# Patient Record
Sex: Female | Born: 1965 | Hispanic: Yes | Marital: Married | Smoking: Never smoker
Health system: Southern US, Community
[De-identification: ages and names within clinical notes are randomized; demographics above are authoritative.]

## PROBLEM LIST (undated history)

## (undated) HISTORY — PX: BILATERAL CARPAL TUNNEL RELEASE: SHX6508

---

## 2006-01-05 ENCOUNTER — Ambulatory Visit: Payer: Self-pay | Admitting: General Practice

## 2006-01-16 ENCOUNTER — Ambulatory Visit: Payer: Self-pay | Admitting: Family Medicine

## 2006-02-10 ENCOUNTER — Ambulatory Visit: Payer: Self-pay | Admitting: Family Medicine

## 2006-09-16 ENCOUNTER — Other Ambulatory Visit: Payer: Self-pay

## 2006-09-16 ENCOUNTER — Observation Stay: Payer: Self-pay | Admitting: Internal Medicine

## 2006-09-17 ENCOUNTER — Other Ambulatory Visit: Payer: Self-pay

## 2007-06-07 IMAGING — MG MAM [PHONE_NUMBER]^^MAM DGTL ADD VW LT  SCR^^
1 series · 2 of 2 positions shown · non-contrast
Comparison: none

REASON FOR EXAM: Left Breast Nodular Densities
COMMENTS:

[Series 6324: L MLO · left · 2 of 2 slices shown]
[im 1/2]
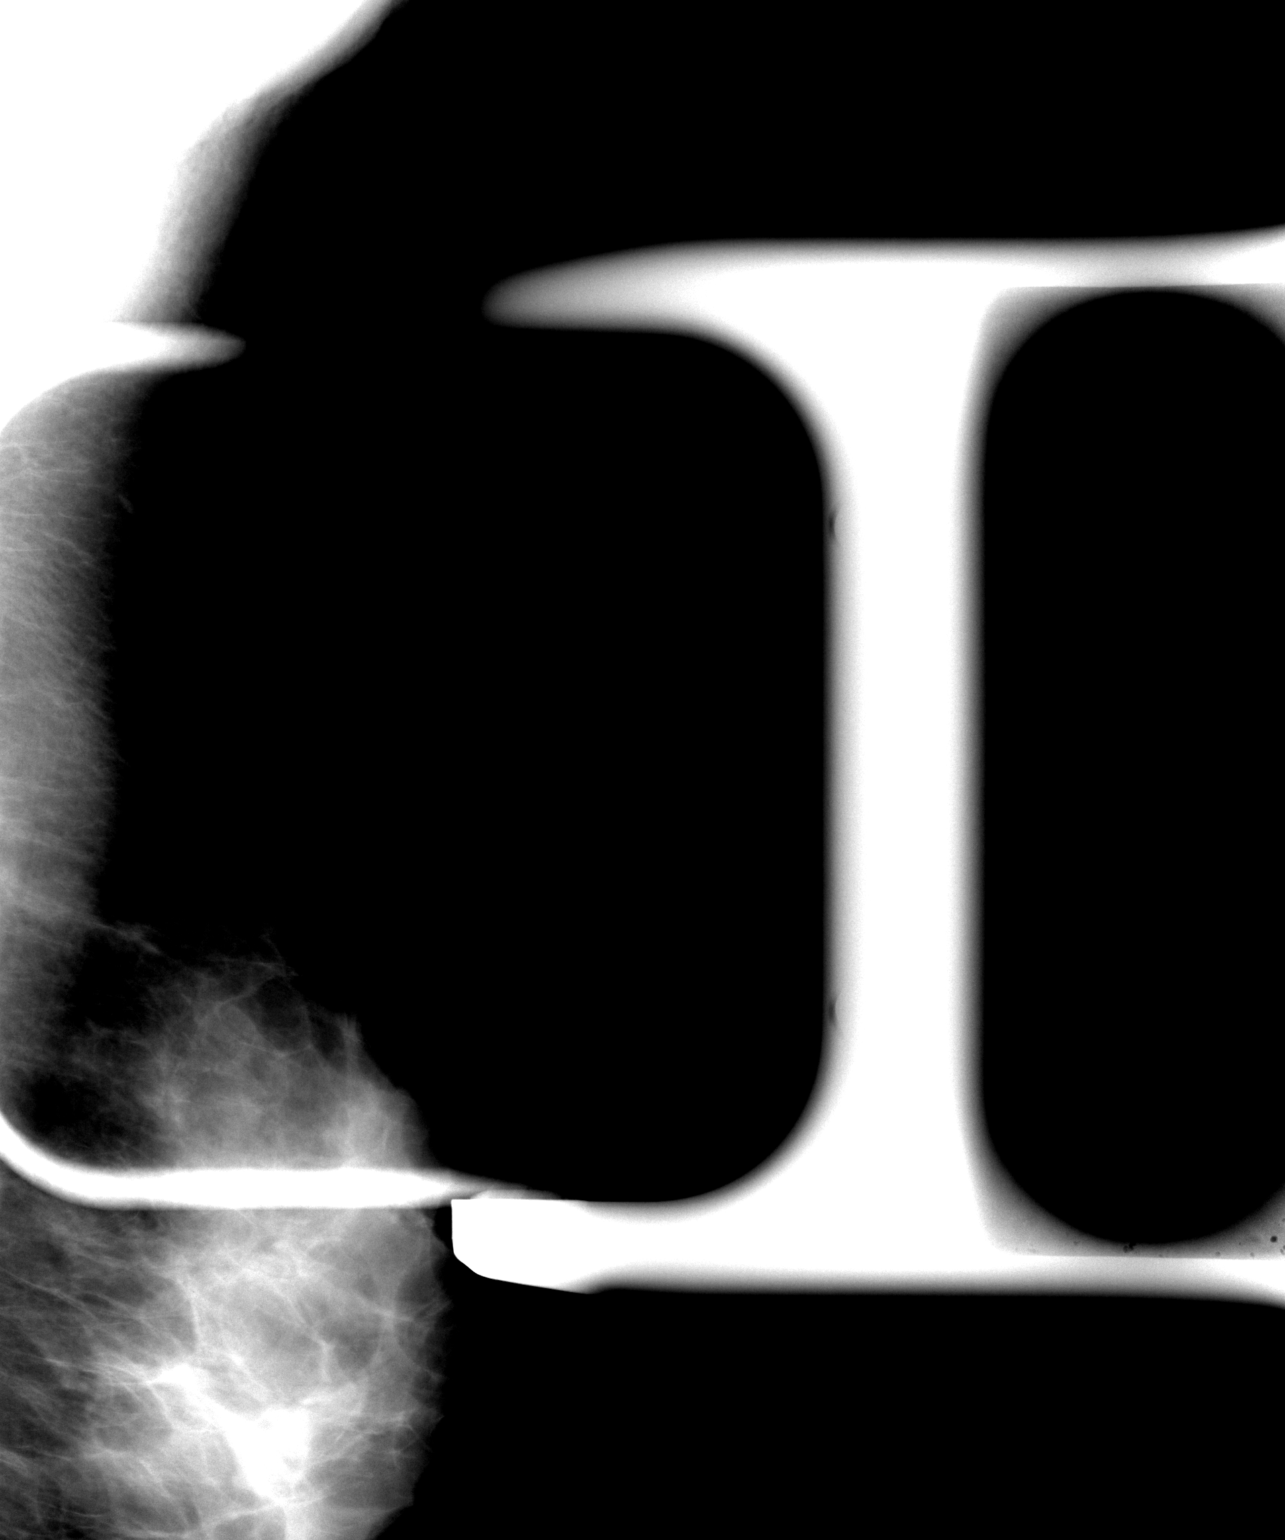
[im 2/2]
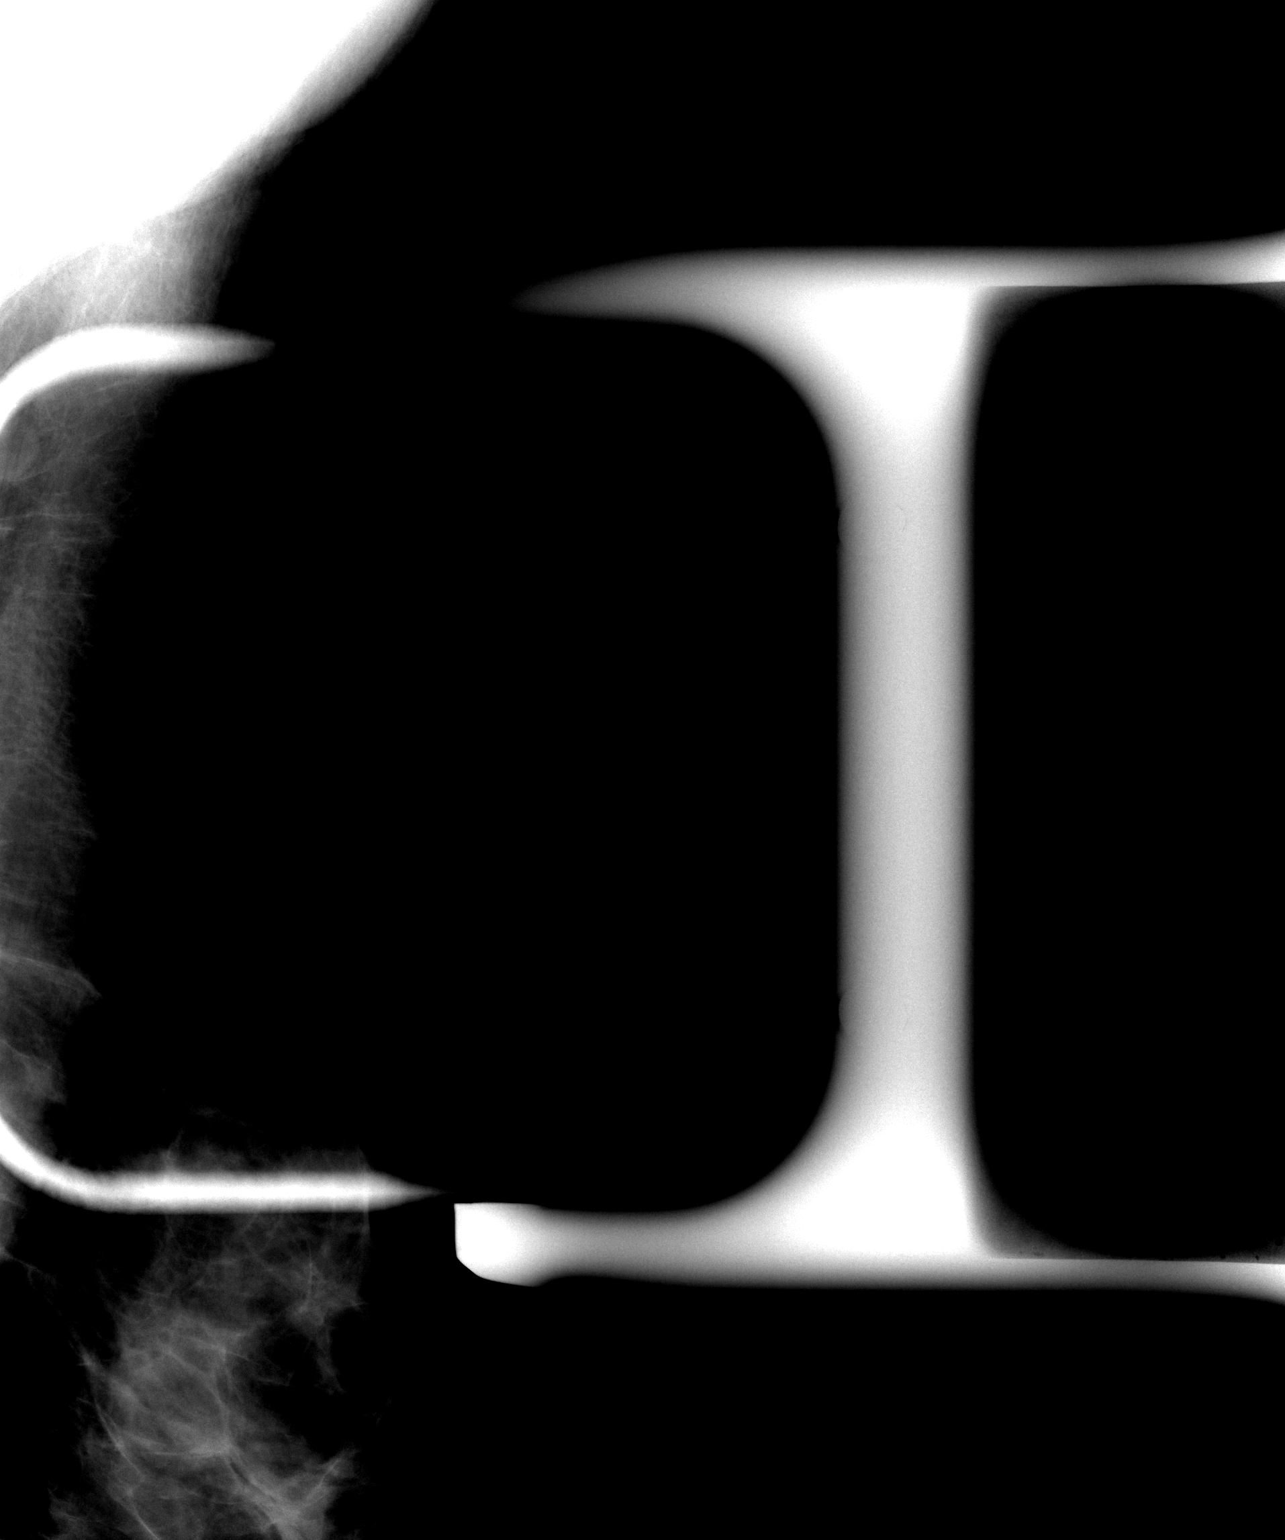

[2 of 2 positions shown; findings below may reference images not displayed]

PROCEDURE:     MAM - MAM DGTL ADD VW LT  SCR  - January 16, 2006 [DATE]

RESULT:        Nodular densities noted in the LEFT breast.  Compression spot
films reveal fat in the nodular densities suggesting that this is benign
intramammary lymph nodes.  No further evaluation of this tiny nodular
density in the LEFT breast is necessary.
IMPRESSION: 1.     Persistent small lymph nodes in the LEFT axilla noted, clinically
correlation suggested.
2.     Yearly follow up mammogram is suggested.
3.     BI-RADS: Category 2-Benign Findings.

A NEGATIVE MAMMOGRAM REPORT DOES NOT PRECLUDE BIOPSY OR OTHER EVALUATION OF
A CLINICALLY PALPABLE OR OTHERWISE SUSPICIOUS MASS OR LESION.   BREAST

## 2007-10-03 ENCOUNTER — Ambulatory Visit: Payer: Self-pay

## 2007-10-16 ENCOUNTER — Ambulatory Visit: Payer: Self-pay

## 2014-06-05 ENCOUNTER — Ambulatory Visit: Payer: Self-pay | Admitting: Specialist

## 2014-06-10 ENCOUNTER — Ambulatory Visit: Payer: Self-pay | Admitting: Specialist

## 2014-09-16 ENCOUNTER — Ambulatory Visit: Payer: Self-pay | Admitting: Internal Medicine

## 2014-10-11 NOTE — Op Note (Signed)
PATIENT NAMGennie Jackson:  Lanphier, [name]:  [id number] DATE OF BIRTH:  Jan 02, 1966  DATE OF PROCEDURE:  06/10/2014  PREOPERATIVE DIAGNOSIS: Bilateral carpal tunnel syndrome.   POSTOPERATIVE DIAGNOSIS: Bilateral carpal tunnel syndrome.   PROCEDURES PERFORMED:  1.  Right carpal tunnel release.  2.  Left carpal tunnel release.   SURGEON:  Myra Rudehristopher Breleigh Carpino, M.D.   ANESTHESIA: General.   COMPLICATIONS: None.   TOURNIQUET TIME: 10 minutes on the right and 14 minutes on the left.   PROCEDURE: After adequate induction of general anesthesia both upper extremities were thoroughly prepped with alcohol and ChloraPrep and draped in standard sterile fashion. Identical procedures are performed on each side. The extremity is wrapped out with the Esmarch bandage and pneumatic tourniquet elevated to 250 mmHg. The tourniquet on the left was actually placed on the forearm and the tourniquet was elevated to 150 mmHg. Under loupe magnification, standard volar carpal tunnel incision is made and the dissection carefully carried down to the transverse retinacular ligament. Incision is made in the midportion of the ligament. The distal release is performed with the small scissors. The proximal release is performed with the small scissors and the carpal tunnel scissors. There is seen to be moderate compression of the nerve directly beneath the ligament on each side. The wound is thoroughly irrigated multiple times. Skin edges are infiltrated with 0.5% plain Marcaine. The skin is closed with 4-0 nylon. A soft bulky dressing is applied. The tourniquet is released. The patient is returned to the recovery room in satisfactory condition having tolerated the procedure quite well.    ____________________________ Clare Gandyhristopher E. Faryal Marxen, MD ces:at D: 06/10/2014 08:58:22 ET T: 06/10/2014 09:38:58 ET JOB#: [id number]  cc: Clare Gandyhristopher E. Aditri Louischarles, MD, <Dictator> Clare GandyHRISTOPHER E Horace Lukas MD ELECTRONICALLY SIGNED 06/15/2014 16:56

## 2017-03-28 ENCOUNTER — Other Ambulatory Visit: Payer: Self-pay | Admitting: Internal Medicine

## 2017-03-28 DIAGNOSIS — Z1231 Encounter for screening mammogram for malignant neoplasm of breast: Secondary | ICD-10-CM

## 2017-04-18 ENCOUNTER — Ambulatory Visit
Admission: RE | Admit: 2017-04-18 | Discharge: 2017-04-18 | Disposition: A | Discharge status: Home / Self Care | Payer: BLUE CROSS/BLUE SHIELD | Source: Ambulatory Visit | Attending: Internal Medicine | Admitting: Internal Medicine

## 2017-04-18 DIAGNOSIS — Z1231 Encounter for screening mammogram for malignant neoplasm of breast: Secondary | ICD-10-CM | POA: Insufficient documentation

## 2017-07-14 ENCOUNTER — Encounter: Payer: Self-pay | Admitting: *Deleted

## 2017-09-29 ENCOUNTER — Encounter: Payer: Self-pay | Admitting: Internal Medicine

## 2017-10-25 ENCOUNTER — Encounter: Payer: Self-pay | Admitting: *Deleted

## 2017-12-12 ENCOUNTER — Encounter: Payer: Self-pay | Admitting: Gastroenterology

## 2017-12-12 ENCOUNTER — Ambulatory Visit: Payer: BLUE CROSS/BLUE SHIELD | Admitting: Gastroenterology

## 2017-12-12 ENCOUNTER — Other Ambulatory Visit
Admission: RE | Admit: 2017-12-12 | Discharge: 2017-12-12 | Disposition: A | Discharge status: Home / Self Care | Payer: BLUE CROSS/BLUE SHIELD | Source: Ambulatory Visit | Attending: Gastroenterology | Admitting: Gastroenterology

## 2017-12-12 VITALS — BP 144/82 | HR 71 | Ht 61.0 in | Wt 145.8 lb

## 2017-12-12 DIAGNOSIS — K5900 Constipation, unspecified: Secondary | ICD-10-CM

## 2017-12-12 DIAGNOSIS — R1013 Epigastric pain: Secondary | ICD-10-CM | POA: Diagnosis not present

## 2017-12-12 DIAGNOSIS — G8929 Other chronic pain: Secondary | ICD-10-CM | POA: Diagnosis not present

## 2017-12-12 DIAGNOSIS — R1084 Generalized abdominal pain: Secondary | ICD-10-CM | POA: Diagnosis not present

## 2017-12-12 DIAGNOSIS — K589 Irritable bowel syndrome without diarrhea: Secondary | ICD-10-CM | POA: Diagnosis not present

## 2017-12-12 LAB — CBC WITH DIFFERENTIAL/PLATELET
BASOS ABS: 0 10*3/uL (ref 0–0.1)
BASOS PCT: 1 %
EOS ABS: 0.1 10*3/uL (ref 0–0.7)
EOS PCT: 2 %
HCT: 36.6 % (ref 35.0–47.0)
Hemoglobin: 12.8 g/dL (ref 12.0–16.0)
LYMPHS ABS: 2.4 10*3/uL (ref 1.0–3.6)
Lymphocytes Relative: 37 %
MCH: 30.7 pg (ref 26.0–34.0)
MCHC: 34.9 g/dL (ref 32.0–36.0)
MCV: 88.1 fL (ref 80.0–100.0)
Monocytes Absolute: 0.5 10*3/uL (ref 0.2–0.9)
Monocytes Relative: 7 %
Neutro Abs: 3.4 10*3/uL (ref 1.4–6.5)
Neutrophils Relative %: 53 %
PLATELETS: 250 10*3/uL (ref 150–440)
RBC: 4.16 MIL/uL (ref 3.80–5.20)
RDW: 12.9 % (ref 11.5–14.5)
WBC: 6.4 10*3/uL (ref 3.6–11.0)

## 2017-12-12 LAB — HEPATIC FUNCTION PANEL
ALBUMIN: 4.1 g/dL (ref 3.5–5.0)
ALK PHOS: 91 U/L (ref 38–126)
ALT: 22 U/L (ref 0–44)
AST: 23 U/L (ref 15–41)
Bilirubin, Direct: <0.1 mg/dL (ref 0.0–0.2)
TOTAL PROTEIN: 7.6 g/dL (ref 6.5–8.1)
Total Bilirubin: 0.4 mg/dL (ref 0.3–1.2)

## 2017-12-12 MED ORDER — OMEPRAZOLE 40 MG PO CPDR: 40.0000 mg | DELAYED_RELEASE_CAPSULE | Freq: Every day | ORAL | 3 refills | Status: DC

## 2017-12-12 MED ORDER — OMEPRAZOLE 40 MG PO CPDR: 40.0000 mg | DELAYED_RELEASE_CAPSULE | Freq: Every day | ORAL | 3 refills | Status: AC

## 2017-12-12 NOTE — Progress Notes (Signed)
Wyline MoodKiran Devaeh Amadi MD, MRCP(U.K) [address]  [address]  [place], [place] [address]  Main: [phone]  Fax: [phone]   Gastroenterology Consultation  Referring Provider:     Center, Darcella Gasmanharles [facility] Primary Care Physician:  Center, [facility] Primary Gastroenterologist:  Dr. Wyline MoodKiran Senica Crall  Reason for Consultation:     Colonoscopy,abdominal pain         HPI:   Rebecca Jackson is a 52 y.o. y/o female referred for consultation & management  by Dr. Eli Phillipsenter, [facility].    She has been referred back in 03/2017 for a colonoscopy . No recent labs or imaging to review.  She also says she has abdominal pain . She is here with an interpretor .   Abdominal pain: Onset: Began 1 year, getting worse. Occurs every day at times and sometimes atleast once a week. Each episode lasts an hour  Site :Epiagstric  Radiation: localized mostly and at timees makes her breathing hard , sleeps almost sitting up and it helps  Severity : 4-6/10  Ashby Dawesature of pain: sharp  Aggravating factors: eating , pain occurs 40 mins after eating - if she drinks water gets worse. Her mother had her gall bladder taken out, her sister as well.  Relieving factors :yoghurt  Weight loss: gained  NSAID use: once a month  PPI use :her doctor prescribed something which she has not taken  Gall bladder surgery: intact  Frequency of bowel movements: every other day , hard  Change in bowel movements: always like this  Relief with bowel movements: yes  Gas/Bloating/Abdominal distension: yes and better after a bowel movement   She has not had a colonoscopy. No family history of colon cancer or polyps.       History reviewed. No pertinent past medical history.    Prior to Admission medications   Medication Sig Start Date End Date Taking? Authorizing Provider  cyclobenzaprine (FLEXERIL) 10 MG tablet Take by mouth. 05/24/15  Yes [provider]    No family history on file.    Social History   Tobacco Use  . Smoking status: Never Smoker  . Smokeless tobacco: Never Used  Substance Use Topics  . Alcohol use: Not Currently  . Drug use: Never    Allergies as of 12/12/2017  . (Not on File)    Review of Systems:    All systems reviewed and negative except where noted in HPI.   Physical Exam:  BP (!) 144/82 (BP Location: Left Arm, Patient Position: Sitting, Cuff Size: Normal)   Pulse 71   Ht 5\' 1"  (1.549 m)   Wt 145 lb 12.8 oz (66.1 kg)   BMI 27.55 kg/m  No LMP recorded. Patient is premenopausal. Psych:  Alert and cooperative. Normal mood and affect. General:   Alert,  Well-developed, well-nourished, pleasant and cooperative in NAD Head:  Normocephalic and atraumatic. Eyes:  Sclera clear, no icterus.   Conjunctiva pink. Ears:  Normal auditory acuity. Nose:  No deformity, discharge, or lesions. Mouth:  No deformity or lesions,oropharynx pink & moist. Neck:  Supple; no masses or thyromegaly. Lungs:  Respirations even and unlabored.  Clear throughout to auscultation.   No wheezes, crackles, or rhonchi. No acute distress. Heart:  Regular rate and rhythm; no murmurs, clicks, rubs, or gallops. Abdomen:  Normal bowel sounds.  No bruits.  Soft, non-tender and non-distended without masses, hepatosplenomegaly or hernias noted.  No guarding or rebound tenderness.    Neurologic:  Alert and oriented  x3;  grossly normal neurologically. Skin:  Intact without significant lesions or rashes. No jaundice. Lymph Nodes:  No significant cervical adenopathy. Psych:  Alert and cooperative. Normal mood and affect.  Imaging Studies: No results found.  Assessment and Plan:   Rebecca Jackson is a 52 y.o. y/o female has been referred for colonosocpy and abdominal pain. Differentials for abdominal pain are IBS-C, peptic ulcers, biliary colic.   Plan  1. Trial of lizness 72 mcg - 2 weeks samples provided 2. Trial of PPI,check H pylori stool antigen  3. EGD+colonoscopy  4.  RUQ USG  I have discussed alternative options, risks & benefits,  which include, but are not limited to, bleeding, infection, perforation,respiratory complication & drug reaction.  The patient agrees with this plan & written consent will be obtained.     Follow up in 4 weeks   Dr Wyline Mood MD,MRCP(U.K)

## 2017-12-12 NOTE — Patient Instructions (Addendum)
1. [facility] en [phone] cita de ultrasonido  2.  Tomar diariamente 1 tableta de Linzess 72mcg.  3. Heces mas pruebas en el [facility]atorio [facility]

## 2017-12-13 ENCOUNTER — Other Ambulatory Visit
Admission: RE | Admit: 2017-12-13 | Discharge: 2017-12-13 | Disposition: A | Discharge status: Home / Self Care | Payer: BLUE CROSS/BLUE SHIELD | Source: Ambulatory Visit | Attending: Gastroenterology | Admitting: Gastroenterology

## 2017-12-13 DIAGNOSIS — R1013 Epigastric pain: Secondary | ICD-10-CM | POA: Diagnosis present

## 2017-12-13 DIAGNOSIS — G8929 Other chronic pain: Secondary | ICD-10-CM | POA: Diagnosis not present

## 2017-12-14 ENCOUNTER — Encounter: Payer: Self-pay | Admitting: Gastroenterology

## 2017-12-15 LAB — H. PYLORI ANTIGEN, STOOL: H. PYLORI STOOL AG, EIA: NEGATIVE

## 2017-12-19 ENCOUNTER — Ambulatory Visit
Admission: RE | Admit: 2017-12-19 | Discharge: 2017-12-19 | Disposition: A | Discharge status: Home / Self Care | Payer: BLUE CROSS/BLUE SHIELD | Source: Ambulatory Visit | Attending: Gastroenterology | Admitting: Gastroenterology

## 2017-12-19 DIAGNOSIS — G8929 Other chronic pain: Secondary | ICD-10-CM | POA: Diagnosis present

## 2017-12-19 DIAGNOSIS — R1013 Epigastric pain: Secondary | ICD-10-CM | POA: Insufficient documentation

## 2017-12-20 ENCOUNTER — Encounter: Payer: Self-pay | Admitting: Gastroenterology

## 2018-01-11 ENCOUNTER — Telehealth: Payer: Self-pay

## 2018-01-11 ENCOUNTER — Other Ambulatory Visit: Payer: Self-pay

## 2018-01-11 NOTE — Telephone Encounter (Signed)
Dr. Tobi BastosAnna, patients husband called to scheduled colonoscopy.  I thought it was a screening colonoscopy but noticed that you had seen her in the office for abdominal pain, epigastric pain on 12/12/17.  During this visit your plan was for EGD and Colonoscopy.  This is patients first colonoscopy.  What should the diagnosis code be for EGD/Colon?  I wanted to check with you before entering the order.  Scheduling for 02/05/18.  Please advise.  Thanks [employer]

## 2018-01-12 ENCOUNTER — Other Ambulatory Visit: Payer: Self-pay

## 2018-01-12 DIAGNOSIS — R1013 Epigastric pain: Secondary | ICD-10-CM

## 2018-01-12 DIAGNOSIS — Z1211 Encounter for screening for malignant neoplasm of colon: Secondary | ICD-10-CM

## 2018-01-12 NOTE — Telephone Encounter (Signed)
Yes EGD, colonoscopy was for abdonminal pain

## 2018-01-23 ENCOUNTER — Ambulatory Visit: Payer: BLUE CROSS/BLUE SHIELD | Admitting: Gastroenterology

## 2018-01-23 ENCOUNTER — Encounter: Payer: Self-pay | Admitting: Gastroenterology

## 2018-01-23 VITALS — BP 118/72 | HR 75 | Ht 61.0 in | Wt 148.2 lb

## 2018-01-23 DIAGNOSIS — K5900 Constipation, unspecified: Secondary | ICD-10-CM | POA: Diagnosis not present

## 2018-01-23 NOTE — Progress Notes (Signed)
   Wyline MoodKiran Braylan Faul MD, MRCP(U.K) [address]  [address]  [place], [place] [address]  Main: [phone]  Fax: [phone]   Primary Care Physician: Center[facility]  Primary Gastroenterologist:  Dr. Wyline MoodKiran Corbin Hott   No chief complaint on file.   HPI: Rebecca Jackson is a 52 y.o. female   Summary of history :  She is here today to see me as a follow up visit to her initial visit on 12/12/17 , abdominal pain began 1 year back, daily , each episode lasting 1 hour , epigastric, non radiating , 40 mins after eating , gained weight , was not on a PPI, constipation , relief with a bowel movements. She also had gas and bloating. Differentials were IBS-C, peptic ulcers, biliary colic   Interval history   1/30/86576/25/2019-  01/23/2018  12/19/17: RUQ USG: Normal  Scheduled for EGD+colonoscopy 02/05/18   H pylori stool antigen negative  She is here back with her interpretor form the hospital .   She says that she took the medications only for a few days. She said she didn't want to use the rest room at work.   While on the medications she was feeling much better, While on Linzess she was having a bowel movement every time she eats. Thinks the medications worked well.    Current Outpatient Medications  Medication Sig Dispense Refill  . omeprazole (PRILOSEC) 40 MG capsule Take 1 capsule (40 mg total) by mouth daily. 90 capsule 3   No current facility-administered medications for this visit.     Allergies as of 01/23/2018  . (Not on File)    ROS:  General: Negative for anorexia, weight loss, fever, chills, fatigue, weakness. ENT: Negative for hoarseness, difficulty swallowing , nasal congestion. CV: Negative for chest pain, angina, palpitations, dyspnea on exertion, peripheral edema.  Respiratory: Negative for dyspnea at rest, dyspnea on exertion, cough, sputum, wheezing.  GI: See history of present illness. GU:  Negative for dysuria, hematuria, urinary incontinence,  urinary frequency, nocturnal urination.  Endo: Negative for unusual weight change.    Physical Examination:   There were no vitals taken for this visit.  General: Well-nourished, well-developed in no acute distress.  Eyes: No icterus. Conjunctivae pink. Mouth: Oropharyngeal mucosa moist and pink , no lesions erythema or exudate. Lungs: Clear to auscultation bilaterally. Non-labored. Heart: Regular rate and rhythm, no murmurs rubs or gallops.  Abdomen: Bowel sounds are normal, nontender, nondistended, no hepatosplenomegaly or masses, no abdominal bruits or hernia , no rebound or guarding.   Extremities: No lower extremity edema. No clubbing or deformities. Neuro: Alert and oriented x 3.  Grossly intact. Skin: Warm and dry, no jaundice.   Psych: Alert and cooperative, normal mood and affect.   Imaging Studies: No results found.  Assessment and Plan:   Rebecca Jackson is a 52 y.o. y/o female here to follow up for  abdominal pain. Differentials for abdominal pain are IBS-C vs acid reflux.    Plan  1.Continue lizness 72 mcg  2. Continue PPI  3. Await EGD+colonoscopy that has been scheduled   The entire visit was conducted with the help of an interpretor  Dr Wyline MoodKiran Colonel Krauser  MD,MRCP [facility](U.K) Follow up in 3 months

## 2018-02-02 ENCOUNTER — Encounter: Payer: Self-pay | Admitting: Emergency Medicine

## 2018-02-05 ENCOUNTER — Ambulatory Visit: Payer: BLUE CROSS/BLUE SHIELD | Admitting: Anesthesiology

## 2018-02-05 ENCOUNTER — Encounter
Admission: RE | Disposition: A | Discharge status: Home / Self Care | Payer: Self-pay | Source: Ambulatory Visit | Attending: Gastroenterology

## 2018-02-05 ENCOUNTER — Encounter: Payer: Self-pay | Admitting: *Deleted

## 2018-02-05 ENCOUNTER — Ambulatory Visit
Admission: RE | Admit: 2018-02-05 | Discharge: 2018-02-05 | Disposition: A | Discharge status: Home / Self Care | Payer: BLUE CROSS/BLUE SHIELD | Source: Ambulatory Visit | Attending: Gastroenterology | Admitting: Gastroenterology

## 2018-02-05 DIAGNOSIS — R1013 Epigastric pain: Secondary | ICD-10-CM

## 2018-02-05 DIAGNOSIS — R109 Unspecified abdominal pain: Secondary | ICD-10-CM | POA: Insufficient documentation

## 2018-02-05 HISTORY — PX: COLONOSCOPY WITH PROPOFOL: SHX5780

## 2018-02-05 HISTORY — PX: ESOPHAGOGASTRODUODENOSCOPY (EGD) WITH PROPOFOL: SHX5813

## 2018-02-05 LAB — POCT PREGNANCY, URINE: PREG TEST UR: NEGATIVE

## 2018-02-05 SURGERY — COLONOSCOPY WITH PROPOFOL
Anesthesia: General

## 2018-02-05 MED ORDER — PROPOFOL 500 MG/50ML IV EMUL
INTRAVENOUS | Status: DC | PRN
  Administered 2018-02-05: 175 ug/kg/min via INTRAVENOUS

## 2018-02-05 MED ORDER — SODIUM CHLORIDE 0.9 % IV SOLN
INTRAVENOUS | Status: DC
  Administered 2018-02-05: 09:00:00 via INTRAVENOUS

## 2018-02-05 MED ORDER — PROPOFOL 500 MG/50ML IV EMUL
INTRAVENOUS | Status: AC
  Filled 2018-02-05: qty 50

## 2018-02-05 MED ORDER — LIDOCAINE HCL (PF) 2 % IJ SOLN
INTRAMUSCULAR | Status: AC
  Filled 2018-02-05: qty 10

## 2018-02-05 MED ORDER — PROPOFOL 10 MG/ML IV BOLUS
INTRAVENOUS | Status: DC | PRN
Start: 2018-02-05 — End: 2018-02-05
  Administered 2018-02-05: 60 mg via INTRAVENOUS

## 2018-02-05 MED ORDER — LIDOCAINE HCL (CARDIAC) PF 100 MG/5ML IV SOSY
PREFILLED_SYRINGE | INTRAVENOUS | Status: DC | PRN
  Administered 2018-02-05: 50 mg via INTRAVENOUS

## 2018-02-05 NOTE — Op Note (Signed)
[facility] Gastroenterology Patient Name: Rebecca Jackson Procedure Date: 02/05/2018 9:24 AM MRN: [id number] Account #: [id number] Date of Birth: 09/06/1965 Admit Type: Outpatient Age: 52 [facility] Gender: Female Note Status: Finalized Procedure:            Colonoscopy Indications:          Abdominal pain Providers:            Wyline MoodKiran Tyese Finken MD, MD Referring MD:         Renne MuscaHarriet P. Burns MD, MD (Referring MD) Medicines:            Monitored Anesthesia Care Complications:        No immediate complications. Procedure:            Pre-Anesthesia Assessment:                       - Prior to the procedure, a History and Physical was                        performed, and patient medications, allergies and                        sensitivities were reviewed. The patient's tolerance of                        previous anesthesia was reviewed.                       - The risks and benefits of the procedure and the                        sedation options and risks were discussed with the                        patient. All questions were answered and informed                        consent was obtained.                       - ASA Grade Assessment: II - A patient with mild                        systemic disease.                       After obtaining informed consent, the colonoscope was                        passed under direct vision. Throughout the procedure,                        the patient's blood pressure, pulse, and oxygen                        saturations were monitored continuously. The                        Colonoscope was introduced through the anus and  advanced to the the cecum, identified by the                        appendiceal orifice, IC valve and transillumination.                        The colonoscopy was performed with ease. The patient                        tolerated the procedure well. The quality of the bowel                  preparation was excellent. Findings:      The entire examined colon appeared normal on direct and retroflexion       views. Impression:           - The entire examined colon is normal on direct and                        retroflexion views.                       - No specimens collected. Recommendation:       - Discharge patient to home (with escort).                       - Resume previous diet.                       - Continue present medications.                       - Repeat colonoscopy in 10 years for screening purposes. Procedure Code(s):    --- Professional ---                       [phone], Colonoscopy, flexible; diagnostic, including                        collection of specimen(s) by brushing or washing, when                        performed (separate procedure) Diagnosis Code(s):    --- Professional ---                       R10.9, Unspecified abdominal pain CPT copyright 2017 American Medical Association. All rights reserved. The codes documented in this report are preliminary and upon coder review may  be revised to meet current compliance requirements. Wyline MoodKiran Meko Bellanger, MD Wyline MoodKiran Garl Speigner MD, MD 02/05/2018 9:52:00 AM This report has been signed electronically. Number of Addenda: 0 Note Initiated On: 02/05/2018 9:24 AM Scope Withdrawal Time: 0 hours 10 minutes 48 seconds  Total Procedure Duration: 0 hours 15 minutes 10 seconds       [facility]

## 2018-02-05 NOTE — Op Note (Signed)
[facility] Gastroenterology Patient Name: Rebecca Jackson Procedure Date: 02/05/2018 9:24 AM MRN: [id number] Account #: [id number] Date of Birth: 01/13/1966 Admit Type: Outpatient Age: 52 [facility] Gender: Female Note Status: Finalized Procedure:            Upper GI endoscopy Indications:          Abdominal pain Providers:            Wyline MoodKiran Kaelob Persky MD, MD Referring MD:         Renne MuscaHarriet P. Burns MD, MD (Referring MD) Medicines:            Monitored Anesthesia Care Complications:        No immediate complications. Procedure:            Pre-Anesthesia Assessment:                       - Prior to the procedure, a History and Physical was                        performed, and patient medications, allergies and                        sensitivities were reviewed. The patient's tolerance of                        previous anesthesia was reviewed.                       - The risks and benefits of the procedure and the                        sedation options and risks were discussed with the                        patient. All questions were answered and informed                        consent was obtained.                       - ASA Grade Assessment: II - A patient with mild                        systemic disease.                       After obtaining informed consent, the endoscope was                        passed under direct vision. Throughout the procedure,                        the patient's blood pressure, pulse, and oxygen                        saturations were monitored continuously. The Endoscope                        was introduced through the mouth, and advanced to the  third part of duodenum. The upper GI endoscopy was                        accomplished with ease. The patient tolerated the                        procedure well. Findings:      The esophagus was normal.      The stomach was normal.      The examined duodenum  was normal. Impression:           - Normal esophagus.                       - Normal stomach.                       - Normal examined duodenum.                       - No specimens collected. Recommendation:       - Perform a colonoscopy today. Procedure Code(s):    --- Professional ---                       [phone], Esophagogastroduodenoscopy, flexible, transoral;                        diagnostic, including collection of specimen(s) by                        brushing or washing, when performed (separate procedure) Diagnosis Code(s):    --- Professional ---                       R10.9, Unspecified abdominal pain CPT copyright 2017 American Medical Association. All rights reserved. The codes documented in this report are preliminary and upon coder review may  be revised to meet current compliance requirements. Wyline MoodKiran Jadarian Mckay, MD Wyline MoodKiran Ivaan Liddy MD, MD 02/05/2018 9:33:16 AM This report has been signed electronically. Number of Addenda: 0 Note Initiated On: 02/05/2018 9:24 AM      [facility]

## 2018-02-05 NOTE — Anesthesia Procedure Notes (Signed)
Date/Time: 02/05/2018 9:26 AM Performed by: Ginger CarneMichelet, Kaeo Jacome, CRNA Pre-anesthesia Checklist: Patient identified, Emergency Drugs available, Suction available, Patient being monitored and Timeout performed Patient Re-evaluated:Patient Re-evaluated prior to induction Oxygen Delivery Method: Nasal cannula Preoxygenation: Pre-oxygenation with 100% oxygen

## 2018-02-05 NOTE — H&P (Signed)
Wyline MoodKiran Cassandr Cederberg, MD [address], [address], [place], [place], [address] [address], [address], [place], [place], [address] Phone: [phone]  Fax: [phone]  Primary Care Physician:  Center, [facility] Real[facility]les [facility]   Pre-Procedure History & Physical: HPI:  Rebecca Jackson is a 52 y.o. female is here for an endoscopy and colonoscopy    History reviewed. No pertinent past medical history.  History reviewed. No pertinent surgical history.  Prior to Admission medications   Medication Sig Start Date End Date Taking? Authorizing Provider  omeprazole (PRILOSEC) 40 MG capsule Take 1 capsule (40 mg total) by mouth daily. Patient not taking: Reported on 01/23/2018 12/12/17   Wyline MoodAnna, Sangeeta Youse, MD    Allergies as of 01/12/2018  . (Not on File)    Family History  Problem Relation Age of Onset  . Stroke Mother   . Heart failure Father   . Diabetes Sister   . Diabetes Brother     Social History   Socioeconomic History  . Marital status: Married    Spouse name: Not on file  . Number of children: Not on file  . Years of education: Not on file  . Highest education level: Not on file  Occupational History  . Not on file  Social Needs  . Financial resource strain: Not on file  . Food insecurity:    Worry: Not on file    Inability: Not on file  . Transportation needs:    Medical: Not on file    Non-medical: Not on file  Tobacco Use  . Smoking status: Never Smoker  . Smokeless tobacco: Never Used  Substance and Sexual Activity  . Alcohol use: Not Currently  . Drug use: Never  . Sexual activity: Yes  Lifestyle  . Physical activity:    Days per week: Not on file    Minutes per session: Not on file  . Stress: Not on file  Relationships  . Social connections:    Talks on phone: Not on file    Gets together: Not on file    Attends religious service: Not on file    Active member of club or organization: Not on file    Attends meetings of clubs or organizations: Not on  file    Relationship status: Not on file  . Intimate partner violence:    Fear of current or ex partner: Not on file    Emotionally abused: Not on file    Physically abused: Not on file    Forced sexual activity: Not on file  Other Topics Concern  . Not on file  Social History Narrative  . Not on file    Review of Systems: See HPI, otherwise negative ROS  Physical Exam: BP 137/86   Pulse 77   Temp (!) 97.3 F (36.3 C) (Tympanic)   Resp 16   Ht 5\' 1"  (1.549 m)   Wt 66.7 kg   SpO2 99%   BMI 27.78 kg/m  General:   Alert,  pleasant and cooperative in NAD Head:  Normocephalic and atraumatic. Neck:  Supple; no masses or thyromegaly. Lungs:  Clear throughout to auscultation, normal respiratory effort.    Heart:  +S1, +S2, Regular rate and rhythm, No edema. Abdomen:  Soft, nontender and nondistended. Normal bowel sounds, without guarding, and without rebound.   Neurologic:  Alert and  oriented x4;  grossly normal neurologically.  Impression/Plan: Rebecca Jackson is here for an endoscopy and colonoscopy  to be performed for  evaluation  of abdominal pain    Risks, benefits, limitations, and alternatives regarding endoscopy have been reviewed with the patient.  Questions have been answered.  All parties agreeable.   Wyline MoodKiran Dai Apel, MD  02/05/2018, 8:51 AM

## 2018-02-05 NOTE — Anesthesia Postprocedure Evaluation (Signed)
Anesthesia Post Note  Patient: Financial controllerAna Jackson  Procedure(s) Performed: COLONOSCOPY WITH PROPOFOL (N/A ) ESOPHAGOGASTRODUODENOSCOPY (EGD) WITH PROPOFOL (N/A )  Patient location during evaluation: PACU Anesthesia Type: General Level of consciousness: awake and alert Pain management: pain level controlled Vital Signs Assessment: post-procedure vital signs reviewed and stable Respiratory status: spontaneous breathing, nonlabored ventilation and respiratory function stable Cardiovascular status: blood pressure returned to baseline and stable Postop Assessment: no apparent nausea or vomiting Anesthetic complications: no     Last Vitals:  Vitals:   02/05/18 1013 02/05/18 1023  BP: 135/85 (!) 142/80  Pulse: 69 63  Resp: (!) 22 16  Temp:    SpO2: 98% 98%    Last Pain:  Vitals:   02/05/18 1023  TempSrc:   PainSc: 0-No pain                 Jovita GammaKathryn L Fitzgerald

## 2018-02-05 NOTE — Anesthesia Post-op Follow-up Note (Signed)
Anesthesia QCDR form completed.        

## 2018-02-05 NOTE — Anesthesia Preprocedure Evaluation (Signed)
Anesthesia Evaluation  Patient identified by MRN, date of birth, ID band Patient awake    Reviewed: Allergy & Precautions, H&P , NPO status , Patient's Chart, lab work & pertinent test results  Airway Mallampati: I  TM Distance: >3 FB Neck ROM: full    Dental  (+) Upper Dentures, Lower Dentures   Pulmonary neg pulmonary ROS,    breath sounds clear to auscultation       Cardiovascular negative cardio ROS   Rhythm:regular Rate:Normal     Neuro/Psych negative neurological ROS  negative psych ROS   GI/Hepatic negative GI ROS, Neg liver ROS,   Endo/Other  negative endocrine ROS  Renal/GU negative Renal ROS  negative genitourinary   Musculoskeletal   Abdominal   Peds  Hematology negative hematology ROS (+)   Anesthesia Other Findings History reviewed. No pertinent past medical history.  Past Surgical History: No date: BILATERAL CARPAL TUNNEL RELEASE  BMI    Body Mass Index:  27.78 kg/m      Reproductive/Obstetrics negative OB ROS                             Anesthesia Physical Anesthesia Plan  ASA: I  Anesthesia Plan: General   Post-op Pain Management:    Induction:   PONV Risk Score and Plan: TIVA  Airway Management Planned:   Additional Equipment:   Intra-op Plan:   Post-operative Plan:   Informed Consent: I have reviewed the patients History and Physical, chart, labs and discussed the procedure including the risks, benefits and alternatives for the proposed anesthesia with the patient or authorized representative who has indicated his/her understanding and acceptance.   Dental Advisory Given  Plan Discussed with: Anesthesiologist, CRNA and Surgeon  Anesthesia Plan Comments:         Anesthesia Quick Evaluation

## 2018-02-05 NOTE — Transfer of Care (Signed)
Immediate Anesthesia Transfer of Care Note  Patient: Rebecca Jackson  Procedure(s) Performed: COLONOSCOPY WITH PROPOFOL (N/A ) ESOPHAGOGASTRODUODENOSCOPY (EGD) WITH PROPOFOL (N/A )  Patient Location: PACU  Anesthesia Type:General  Level of Consciousness: sedated  Airway & Oxygen Therapy: Patient Spontanous Breathing and Patient connected to nasal cannula oxygen  Post-op Assessment: Report given to RN and Post -op Vital signs reviewed and stable  Post vital signs: Reviewed and stable  Last Vitals:  Vitals Value Taken Time  BP 127/78 02/05/2018  9:54 AM  Temp 35.8 C 02/05/2018  9:53 AM  Pulse 72 02/05/2018  9:54 AM  Resp 18 02/05/2018  9:54 AM  SpO2 100 % 02/05/2018  9:54 AM  Vitals shown include unvalidated device data.  Last Pain:  Vitals:   02/05/18 0953  TempSrc: Tympanic  PainSc: 0-No pain         Complications: No apparent anesthesia complications

## 2018-02-07 ENCOUNTER — Encounter: Payer: Self-pay | Admitting: Gastroenterology

## 2018-04-26 ENCOUNTER — Ambulatory Visit: Payer: BLUE CROSS/BLUE SHIELD | Admitting: Gastroenterology

## 2019-05-22 ENCOUNTER — Other Ambulatory Visit (HOSPITAL_COMMUNITY): Payer: Self-pay | Admitting: Internal Medicine

## 2019-05-22 ENCOUNTER — Other Ambulatory Visit: Payer: Self-pay | Admitting: Internal Medicine

## 2019-05-22 DIAGNOSIS — Z1231 Encounter for screening mammogram for malignant neoplasm of breast: Secondary | ICD-10-CM

## 2019-05-22 DIAGNOSIS — K8050 Calculus of bile duct without cholangitis or cholecystitis without obstruction: Secondary | ICD-10-CM

## 2019-06-19 ENCOUNTER — Ambulatory Visit
Admission: RE | Admit: 2019-06-19 | Discharge: 2019-06-19 | Disposition: A | Discharge status: Home / Self Care | Payer: BC Managed Care – PPO | Source: Ambulatory Visit | Attending: Internal Medicine | Admitting: Internal Medicine

## 2019-06-19 DIAGNOSIS — Z1231 Encounter for screening mammogram for malignant neoplasm of breast: Secondary | ICD-10-CM | POA: Insufficient documentation

## 2021-01-18 ENCOUNTER — Other Ambulatory Visit: Payer: Self-pay | Admitting: Physician Assistant

## 2021-01-18 DIAGNOSIS — Z1231 Encounter for screening mammogram for malignant neoplasm of breast: Secondary | ICD-10-CM

## 2021-02-10 ENCOUNTER — Other Ambulatory Visit: Payer: Self-pay

## 2021-02-10 ENCOUNTER — Ambulatory Visit
Admission: RE | Admit: 2021-02-10 | Discharge: 2021-02-10 | Disposition: A | Discharge status: Home / Self Care | Payer: BC Managed Care – PPO | Source: Ambulatory Visit | Attending: Physician Assistant | Admitting: Physician Assistant

## 2021-02-10 DIAGNOSIS — Z1231 Encounter for screening mammogram for malignant neoplasm of breast: Secondary | ICD-10-CM | POA: Diagnosis not present

## 2022-01-24 ENCOUNTER — Other Ambulatory Visit: Payer: Self-pay | Admitting: Family Medicine

## 2022-01-24 DIAGNOSIS — Z1231 Encounter for screening mammogram for malignant neoplasm of breast: Secondary | ICD-10-CM

## 2022-03-01 ENCOUNTER — Ambulatory Visit
Admission: RE | Admit: 2022-03-01 | Discharge: 2022-03-01 | Disposition: A | Discharge status: Home / Self Care | Payer: BC Managed Care – PPO | Source: Ambulatory Visit | Attending: Family Medicine | Admitting: Family Medicine

## 2022-03-01 DIAGNOSIS — Z1231 Encounter for screening mammogram for malignant neoplasm of breast: Secondary | ICD-10-CM | POA: Diagnosis present

## 2022-03-14 ENCOUNTER — Other Ambulatory Visit: Payer: Self-pay | Admitting: Family Medicine

## 2022-03-14 DIAGNOSIS — R928 Other abnormal and inconclusive findings on diagnostic imaging of breast: Secondary | ICD-10-CM

## 2022-03-14 DIAGNOSIS — N6489 Other specified disorders of breast: Secondary | ICD-10-CM

## 2022-04-11 ENCOUNTER — Ambulatory Visit
Admission: RE | Admit: 2022-04-11 | Discharge: 2022-04-11 | Disposition: A | Discharge status: Home / Self Care | Payer: BC Managed Care – PPO | Source: Ambulatory Visit | Attending: Family Medicine | Admitting: Family Medicine

## 2022-04-11 DIAGNOSIS — N6489 Other specified disorders of breast: Secondary | ICD-10-CM | POA: Insufficient documentation

## 2022-04-11 DIAGNOSIS — R928 Other abnormal and inconclusive findings on diagnostic imaging of breast: Secondary | ICD-10-CM | POA: Diagnosis present

## 2023-03-07 ENCOUNTER — Other Ambulatory Visit: Payer: Self-pay | Admitting: Primary Care

## 2023-03-07 DIAGNOSIS — Z1231 Encounter for screening mammogram for malignant neoplasm of breast: Secondary | ICD-10-CM

## 2023-04-13 ENCOUNTER — Ambulatory Visit
Admission: RE | Admit: 2023-04-13 | Discharge: 2023-04-13 | Disposition: A | Discharge status: Home / Self Care | Payer: BC Managed Care – PPO | Source: Ambulatory Visit | Attending: Primary Care | Admitting: Primary Care

## 2023-04-13 DIAGNOSIS — Z1231 Encounter for screening mammogram for malignant neoplasm of breast: Secondary | ICD-10-CM | POA: Diagnosis present

## 2024-03-18 ENCOUNTER — Other Ambulatory Visit: Payer: Self-pay | Admitting: Family Medicine

## 2024-03-18 DIAGNOSIS — Z1231 Encounter for screening mammogram for malignant neoplasm of breast: Secondary | ICD-10-CM

## 2024-04-17 ENCOUNTER — Ambulatory Visit
Admission: RE | Admit: 2024-04-17 | Discharge: 2024-04-17 | Disposition: A | Discharge status: Home / Self Care | Source: Ambulatory Visit | Attending: Family Medicine | Admitting: Family Medicine

## 2024-04-17 DIAGNOSIS — Z1231 Encounter for screening mammogram for malignant neoplasm of breast: Secondary | ICD-10-CM | POA: Diagnosis present
